# Patient Record
Sex: Male | Born: 1995 | Race: White | Hispanic: No | Marital: Single | Smoking: Current some day smoker
Health system: Southern US, Community
[De-identification: ages and names within clinical notes are randomized; demographics above are authoritative.]

## PROBLEM LIST (undated history)

## (undated) DIAGNOSIS — R569 Unspecified convulsions: Secondary | ICD-10-CM

## (undated) HISTORY — PX: HERNIA REPAIR: SHX51

---
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
## 2015-09-28 ENCOUNTER — Encounter (HOSPITAL_COMMUNITY): Payer: Self-pay | Admitting: *Deleted

## 2015-09-28 ENCOUNTER — Emergency Department (HOSPITAL_COMMUNITY): Payer: PRIVATE HEALTH INSURANCE

## 2015-09-28 ENCOUNTER — Emergency Department (HOSPITAL_COMMUNITY)
Admission: EM | Admit: 2015-09-28 | Discharge: 2015-09-28 | Disposition: A | Discharge status: Home / Self Care | Payer: PRIVATE HEALTH INSURANCE | Attending: Emergency Medicine | Admitting: Emergency Medicine

## 2015-09-28 DIAGNOSIS — R509 Fever, unspecified: Secondary | ICD-10-CM | POA: Insufficient documentation

## 2015-09-28 DIAGNOSIS — R110 Nausea: Secondary | ICD-10-CM | POA: Diagnosis not present

## 2015-09-28 DIAGNOSIS — R6889 Other general symptoms and signs: Secondary | ICD-10-CM

## 2015-09-28 DIAGNOSIS — F17210 Nicotine dependence, cigarettes, uncomplicated: Secondary | ICD-10-CM | POA: Diagnosis not present

## 2015-09-28 DIAGNOSIS — M791 Myalgia: Secondary | ICD-10-CM | POA: Diagnosis not present

## 2015-09-28 MED ORDER — BENZONATATE 100 MG PO CAPS: 100.0000 mg | ORAL_CAPSULE | Freq: Three times a day (TID) | ORAL | Status: AC

## 2015-09-28 MED ORDER — ONDANSETRON 4 MG PO TBDP: 4.0000 mg | ORAL_TABLET | Freq: Three times a day (TID) | ORAL | Status: AC | PRN

## 2015-09-28 MED ORDER — IBUPROFEN 800 MG PO TABS: 800.0000 mg | ORAL_TABLET | Freq: Three times a day (TID) | ORAL | Status: AC

## 2015-09-28 NOTE — ED Notes (Signed)
Pt back to room from xray.

## 2015-09-28 NOTE — ED Notes (Signed)
Pt here with complaint that he is "sick" for the past 3 days he has run a temp between 99 and 103.5.  Pt has has been taking tylenol and ibuprofen at home.  Pt has had URI, nausea and HA, no acute distress in triage.

## 2015-09-28 NOTE — ED Notes (Signed)
Patient transported to X-ray 

## 2015-09-28 NOTE — ED Provider Notes (Signed)
CSN: [id number]     Arrival date & time 09/28/15  2009 History  By signing my name below, I, Sean Lutz, attest that this documentation has been prepared under the direction and in the presence of Kiasha Bellin, PA-C. Electronically Signed: Doreatha Lutz, ED Scribe. 09/28/2015. 9:35 PM.    Chief Complaint  Patient presents with  . Illness   The history is provided by the patient and a relative. No language interpreter was used.   HPI Comments: Sean Lutz is a 20 y.o. male who presents to the Emergency Department complaining of moderate, intermittent fever (Tmax 103.5) onset 3 days ago with associated nausea, generalized myalgias. Pt states he has taken ibuprofen with temporary relief of fever. No known sick contacts with similar symptoms. No recent travel. Per family member, pt has h/o febrile seizures and is not medicated for this issue. Denies emesis, diarrhea, abdominal pain, dysuria, rash, swelling to hands or feet, neuro deficits, additional complaints.   History reviewed. No pertinent past medical history. Past Surgical History  Procedure Laterality Date  . Hernia repair     No family history on file. Social History  Substance Use Topics  . Smoking status: Current Some Day Smoker    Types: Cigarettes  . Smokeless tobacco: None     Comment: occasional smoker  . Alcohol Use: No    Review of Systems  Constitutional: Positive for fever.  Cardiovascular: Negative for leg swelling.  Gastrointestinal: Positive for nausea. Negative for vomiting, abdominal pain and diarrhea.  Genitourinary: Negative for dysuria.  Musculoskeletal: Positive for myalgias ( generalized ).  Skin: Negative for rash.   Allergies  Review of patient's allergies indicates no known allergies.  Home Medications   Prior to Admission medications   Medication Sig Start Date End Date Taking? Authorizing Provider  benzonatate (TESSALON) 100 MG capsule Take 1 capsule (100 mg total) by mouth every 8 (eight)  hours. 09/28/15   Demetress Tift C Halton Neas, PA-C  ibuprofen (ADVIL,MOTRIN) 800 MG tablet Take 1 tablet (800 mg total) by mouth 3 (three) times daily. 09/28/15   Vita Currin C Caren Garske, PA-C  ondansetron (ZOFRAN ODT) 4 MG disintegrating tablet Take 1 tablet (4 mg total) by mouth every 8 (eight) hours as needed for nausea or vomiting. 09/28/15   Endia Moncur C Dequon Schnebly, PA-C   BP 119/64 mmHg  Pulse 83  Temp(Src) 99.5 F (37.5 C) (Oral)  Resp 16  Ht  (1.778 m)  Wt 81.647 kg  BMI 25.83 kg/m2  SpO2 99% Physical Exam  Constitutional: He is oriented to person, place, and time. He appears well-developed and well-nourished. No distress.  HENT:  Head: Normocephalic and atraumatic.  Eyes: Conjunctivae are normal. Pupils are equal, round, and reactive to light.  Neck: Normal range of motion. Neck supple.  Cardiovascular: Normal rate, regular rhythm, normal heart sounds and intact distal pulses.  Exam reveals no gallop and no friction rub.   No murmur heard. Pulmonary/Chest: Effort normal and breath sounds normal. No respiratory distress. He has no wheezes. He has no rales.  Abdominal: Soft. He exhibits no mass. There is no tenderness. There is no rebound and no guarding.  Musculoskeletal: Normal range of motion. He exhibits no edema or tenderness.  Lymphadenopathy:    He has no cervical adenopathy.  Neurological: He is alert and oriented to person, place, and time.  Skin: Skin is warm and dry. He is not diaphoretic.  Psychiatric: He has a normal mood and affect. His behavior is normal.  Nursing note and  vitals reviewed.   ED Course  Procedures (including critical care time) DIAGNOSTIC STUDIES: Oxygen Saturation is 95% on RA, adequate by my interpretation.    COORDINATION OF CARE: 9:30 PM Discussed treatment plan with pt at bedside which includes CXR and pt agreed to plan.   Imaging Review Dg Chest 2 View  09/28/2015  CLINICAL DATA:  Productive cough with shortness of breath, congestion and fever for 3 days. EXAM: CHEST   2 VIEW COMPARISON:  12/24/2010 FINDINGS: The heart size and mediastinal contours are within normal limits. Both lungs are clear. No pleural effusion or pneumothorax. The visualized skeletal structures are unremarkable. IMPRESSION: No active cardiopulmonary disease. Electronically Signed   By: Amie Portlandavid  Ormond M.D.   On: 09/28/2015 20:57   I have personally reviewed and evaluated these images as part of my medical decision-making.   MDM   Final diagnoses:  Flu-like symptoms    Sean Lutz presents to the ED for evaluation of fever, generalized myalgias and nausea for 3 days. CXR negative for active cardiopulmonary disease. Suspect viral etiology. Conservative therapies discussed and recommended. D/t h/o febrile seizures, importance of alternating tylenol and ibuprofen q4h for control of fevers was discussed with pt and outlined in d/c paperwork. Patient advised to follow up with PCP as needed, or with worsening symptoms. Patient appears stable for discharge at this time. Return precautions discussed and outlined in discharge paperwork. Patient is agreeable to plan.    Filed Vitals:   09/28/15 2037 09/28/15 2144  BP: 128/61 119/64  Pulse: 107 83  Temp: 99.5 F (37.5 C)   Resp: 16 16     I personally performed the services described in this documentation, which was scribed in my presence. The recorded information has been reviewed and is accurate.   Anselm PancoastShawn C Fedra Lanter, PA-C 09/28/15 2229  Pricilla LovelessScott Goldston, MD 10/03/15 [id number]

## 2015-09-28 NOTE — Discharge Instructions (Signed)
You have been seen today for flulike symptoms. Your symptoms are consistent with a viral illness. Viruses do not require antibiotics. Treatment is symptomatic care. Drink plenty of fluids and get plenty of rest. You should be drinking at least a liter of water an hour to stay hydrated. Ibuprofen or Tylenol for pain or fever. Zofran for nausea. Tessalon for cough. Plain Mucinex may help relieve congestion. Warm liquids or Chloraseptic spray may help soothe the sore throat. Follow up with PCP as needed. Return to ED should symptoms worsen. Alternate ibuprofen and Tylenol every 4 hours for adequate fever control.

## 2016-08-03 ENCOUNTER — Emergency Department (HOSPITAL_COMMUNITY)
Admission: EM | Admit: 2016-08-03 | Discharge: 2016-08-03 | Disposition: A | Discharge status: Home / Self Care | Payer: PRIVATE HEALTH INSURANCE | Attending: Emergency Medicine | Admitting: Emergency Medicine

## 2016-08-03 ENCOUNTER — Encounter (HOSPITAL_COMMUNITY): Payer: Self-pay | Admitting: Emergency Medicine

## 2016-08-03 DIAGNOSIS — R197 Diarrhea, unspecified: Secondary | ICD-10-CM

## 2016-08-03 DIAGNOSIS — F17210 Nicotine dependence, cigarettes, uncomplicated: Secondary | ICD-10-CM | POA: Insufficient documentation

## 2016-08-03 DIAGNOSIS — R110 Nausea: Secondary | ICD-10-CM | POA: Insufficient documentation

## 2016-08-03 DIAGNOSIS — E860 Dehydration: Secondary | ICD-10-CM | POA: Insufficient documentation

## 2016-08-03 LAB — CBC WITH DIFFERENTIAL/PLATELET
BASOS PCT: 0 %
Basophils Absolute: 0 10*3/uL (ref 0.0–0.1)
EOS ABS: 0.1 10*3/uL (ref 0.0–0.7)
Eosinophils Relative: 1 %
HCT: 38.6 % — ABNORMAL LOW (ref 39.0–52.0)
Hemoglobin: 13.6 g/dL (ref 13.0–17.0)
Lymphocytes Relative: 12 %
Lymphs Abs: 0.8 10*3/uL (ref 0.7–4.0)
MCH: 32.2 pg (ref 26.0–34.0)
MCHC: 35.2 g/dL (ref 30.0–36.0)
MCV: 91.3 fL (ref 78.0–100.0)
MONO ABS: 0.7 10*3/uL (ref 0.1–1.0)
MONOS PCT: 11 %
Neutro Abs: 4.7 10*3/uL (ref 1.7–7.7)
Neutrophils Relative %: 76 %
Platelets: 260 10*3/uL (ref 150–400)
RBC: 4.23 MIL/uL (ref 4.22–5.81)
RDW: 12.1 % (ref 11.5–15.5)
WBC: 6.2 10*3/uL (ref 4.0–10.5)

## 2016-08-03 LAB — COMPREHENSIVE METABOLIC PANEL
ALBUMIN: 3.8 g/dL (ref 3.5–5.0)
ALT: 14 U/L — ABNORMAL LOW (ref 17–63)
ANION GAP: 8 (ref 5–15)
AST: 16 U/L (ref 15–41)
Alkaline Phosphatase: 48 U/L (ref 38–126)
BUN: 8 mg/dL (ref 6–20)
CO2: 25 mmol/L (ref 22–32)
Calcium: 8.5 mg/dL — ABNORMAL LOW (ref 8.9–10.3)
Chloride: 108 mmol/L (ref 101–111)
Creatinine, Ser: 1 mg/dL (ref 0.61–1.24)
GFR calc Af Amer: >60 mL/min (ref >60)
GFR calc non Af Amer: >60 mL/min (ref >60)
GLUCOSE: 97 mg/dL (ref 65–99)
POTASSIUM: 3.9 mmol/L (ref 3.5–5.1)
SODIUM: 141 mmol/L (ref 135–145)
Total Bilirubin: 0.2 mg/dL — ABNORMAL LOW (ref 0.3–1.2)
Total Protein: 5.8 g/dL — ABNORMAL LOW (ref 6.5–8.1)

## 2016-08-03 LAB — URINALYSIS, ROUTINE W REFLEX MICROSCOPIC
BILIRUBIN URINE: NEGATIVE
Glucose, UA: NEGATIVE mg/dL
Hgb urine dipstick: NEGATIVE
KETONES UR: NEGATIVE mg/dL
Leukocytes, UA: NEGATIVE
NITRITE: NEGATIVE
Protein, ur: NEGATIVE mg/dL
Specific Gravity, Urine: 1.013 (ref 1.005–1.030)
pH: 6 (ref 5.0–8.0)

## 2016-08-03 LAB — LIPASE, BLOOD: Lipase: 15 U/L (ref 11–51)

## 2016-08-03 MED ORDER — LOPERAMIDE HCL 2 MG PO CAPS
4.0000 mg | ORAL_CAPSULE | Freq: Once | ORAL | Status: AC
  Administered 2016-08-03: 4 mg via ORAL
  Filled 2016-08-03: qty 2

## 2016-08-03 MED ORDER — ONDANSETRON HCL 4 MG/2ML IJ SOLN
4.0000 mg | Freq: Once | INTRAMUSCULAR | Status: AC
  Administered 2016-08-03: 4 mg via INTRAVENOUS
  Filled 2016-08-03: qty 2

## 2016-08-03 MED ORDER — PROMETHAZINE HCL 25 MG PO TABS: 25.0000 mg | ORAL_TABLET | Freq: Four times a day (QID) | ORAL | 0 refills | Status: AC | PRN

## 2016-08-03 MED ORDER — SODIUM CHLORIDE 0.9 % IV BOLUS (SEPSIS)
1000.0000 mL | Freq: Once | INTRAVENOUS | Status: AC
  Administered 2016-08-03: 1000 mL via INTRAVENOUS

## 2016-08-03 NOTE — ED Provider Notes (Signed)
[facility]-EMERGENCY DEPT Provider Note   CSN: [id number] Arrival date & time: 08/03/16  0701     History   Chief Complaint Chief Complaint  Patient presents with  . Nausea  . Diarrhea    HPI Sean Lutz is a 21 y.o. male.  Pt has had nausea since yesterday at 1500.  Diarrhea woke him out of bed at 0300 and has continued.  Pt denies any fevers or chills.  The pt has not had a cough.  He was worried he had the flu.  The pt did eat some bologna and a birthday cake that might have been old.      History reviewed. No pertinent past medical history.  Patient Active Problem List   Diagnosis Date Noted  . SYNCOPE 07/19/2010  . 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 GENERALIZED PAIN 10/03/2009  . ACROMIOCLAVICULAR SPRAIN AND STRAIN 04/06/2009    Past Surgical History:  Procedure Laterality Date  . HERNIA REPAIR         Home Medications    Prior to Admission medications   Medication Sig Start Date End Date Taking? Authorizing Provider  benzonatate (TESSALON) 100 MG capsule Take 1 capsule (100 mg total) by mouth every 8 (eight) hours. 09/28/15   Shawn C Joy, PA-C  ibuprofen (ADVIL,MOTRIN) 800 MG tablet Take 1 tablet (800 mg total) by mouth 3 (three) times daily. 09/28/15   Shawn C Joy, PA-C  ondansetron (ZOFRAN ODT) 4 MG disintegrating tablet Take 1 tablet (4 mg total) by mouth every 8 (eight) hours as needed for nausea or vomiting. 09/28/15   Shawn C Joy, PA-C  promethazine (PHENERGAN) 25 MG tablet Take 1 tablet (25 mg total) by mouth every 6 (six) hours as needed for nausea or vomiting. 08/03/16   Jacalyn LefevreJulie Chianti Goh, MD    Family History No family history on file.  Social History Social History  Substance Use Topics  . Smoking status: Current Some Day Smoker    Types: Cigarettes  . Smokeless tobacco: Not on file     Comment: occasional smoker  . Alcohol use No     Allergies   Patient has no known allergies.   Review of Systems Review of Systems  Gastrointestinal: Positive  for diarrhea and nausea.  All other systems reviewed and are negative.    Physical Exam Updated Vital Signs BP 111/61   Pulse 76   Temp 97.9 F (36.6 C) (Oral)   Resp 18   SpO2 99%   Physical Exam  Constitutional: He is oriented to person, place, and time. He appears well-developed and well-nourished.  HENT:  Head: Normocephalic and atraumatic.  Right Ear: External ear normal.  Left Ear: External ear normal.  Nose: Nose normal.  Mouth/Throat: Mucous membranes are dry.  Eyes: Conjunctivae and EOM are normal. Pupils are equal, round, and reactive to light.  Neck: Normal range of motion. Neck supple.  Cardiovascular: Normal rate, regular rhythm, normal heart sounds and intact distal pulses.   Pulmonary/Chest: Effort normal and breath sounds normal.  Abdominal: Soft. Bowel sounds are normal.  Musculoskeletal: Normal range of motion.  Neurological: He is alert and oriented to person, place, and time.  Skin: Skin is warm.  Psychiatric: He has a normal mood and affect. His behavior is normal. Judgment and thought content normal.  Nursing note and vitals reviewed.    ED Treatments / Results  Labs (all labs ordered are listed, but only abnormal results are displayed) Labs Reviewed  CBC WITH DIFFERENTIAL/PLATELET - Abnormal; Notable for  the following:       Result Value   HCT 38.6 (*)    All other components within normal limits  URINALYSIS, ROUTINE W REFLEX MICROSCOPIC - Abnormal; Notable for the following:    Color, Urine STRAW (*)    All other components within normal limits  COMPREHENSIVE METABOLIC PANEL - Abnormal; Notable for the following:    Calcium 8.5 (*)    Total Protein 5.8 (*)    ALT 14 (*)    Total Bilirubin 0.2 (*)    All other components within normal limits  GASTROINTESTINAL PANEL BY PCR, STOOL (REPLACES STOOL CULTURE)  LIPASE, BLOOD    EKG  EKG Interpretation None       Radiology No results found.  Procedures Procedures (including critical  care time)  Medications Ordered in ED Medications  sodium chloride 0.9 % bolus 1,000 mL (1,000 mLs Intravenous New Bag/Given 08/03/16 0747)  ondansetron (ZOFRAN) injection 4 mg (4 mg Intravenous Given 08/03/16 0747)  loperamide (IMODIUM) capsule 4 mg (4 mg Oral Given 08/03/16 0747)     Initial Impression / Assessment and Plan / ED Course  I have reviewed the triage vital signs and the nursing notes.  Pertinent labs & imaging results that were available during my care of the patient were reviewed by me and considered in my medical decision making (see chart for details).     Pt is feeling much better.  He has not had any diarrhea while here.  Pt is stable for d/c.  He knows to return if worse.  Final Clinical Impressions(s) / ED Diagnoses   Final diagnoses:  Nausea  Diarrhea, unspecified type  Dehydration    New Prescriptions New Prescriptions   PROMETHAZINE (PHENERGAN) 25 MG TABLET    Take 1 tablet (25 mg total) by mouth every 6 (six) hours as needed for nausea or vomiting.     Jacalyn Lefevre, MD 08/03/16 [phone]

## 2016-08-03 NOTE — ED Triage Notes (Addendum)
Patient presents today with complaints of Nausea and Diarrhea. Patient states Nausea started yesterday at 3pm. Patient states he had of diarrhea at 0300 then went back to bed then had an additional  at 0600 and has been going "every five minutes".  Patient states she has not been in the last 30 minutes, Patient states everyone at work is sick with flu and wanted to make sure it was not flu. Patient denies any Cough. Patient states he does have body achs. Denies any fever.

## 2016-08-03 NOTE — ED Notes (Signed)
Nurse drawing labs. 

## 2017-06-12 ENCOUNTER — Emergency Department: Payer: Self-pay

## 2017-06-12 ENCOUNTER — Emergency Department
Admission: EM | Admit: 2017-06-12 | Discharge: 2017-06-12 | Disposition: A | Discharge status: Home / Self Care | Payer: Self-pay | Attending: Emergency Medicine | Admitting: Emergency Medicine

## 2017-06-12 DIAGNOSIS — R509 Fever, unspecified: Secondary | ICD-10-CM | POA: Insufficient documentation

## 2017-06-12 DIAGNOSIS — J069 Acute upper respiratory infection, unspecified: Secondary | ICD-10-CM | POA: Insufficient documentation

## 2017-06-12 DIAGNOSIS — Z791 Long term (current) use of non-steroidal anti-inflammatories (NSAID): Secondary | ICD-10-CM | POA: Insufficient documentation

## 2017-06-12 DIAGNOSIS — F17210 Nicotine dependence, cigarettes, uncomplicated: Secondary | ICD-10-CM | POA: Insufficient documentation

## 2017-06-12 HISTORY — DX: Unspecified convulsions: R56.9

## 2017-06-12 LAB — BASIC METABOLIC PANEL
ANION GAP: 8 (ref 5–15)
BUN: 14 mg/dL (ref 6–20)
CO2: 25 mmol/L (ref 22–32)
Calcium: 9 mg/dL (ref 8.9–10.3)
Chloride: 104 mmol/L (ref 101–111)
Creatinine, Ser: 1.08 mg/dL (ref 0.61–1.24)
GLUCOSE: 115 mg/dL — AB (ref 65–99)
POTASSIUM: 3.7 mmol/L (ref 3.5–5.1)
Sodium: 137 mmol/L (ref 135–145)

## 2017-06-12 LAB — URINALYSIS, COMPLETE (UACMP) WITH MICROSCOPIC
Bacteria, UA: NONE SEEN
Bilirubin Urine: NEGATIVE
GLUCOSE, UA: NEGATIVE mg/dL
HGB URINE DIPSTICK: NEGATIVE
Ketones, ur: NEGATIVE mg/dL
Leukocytes, UA: NEGATIVE
Nitrite: NEGATIVE
PH: 5 (ref 5.0–8.0)
Protein, ur: NEGATIVE mg/dL
RBC / HPF: NONE SEEN RBC/hpf (ref 0–5)
SPECIFIC GRAVITY, URINE: 1.018 (ref 1.005–1.030)
SQUAMOUS EPITHELIAL / LPF: NONE SEEN

## 2017-06-12 LAB — CBC
HEMATOCRIT: 37.1 % — AB (ref 40.0–52.0)
HEMOGLOBIN: 12.9 g/dL — AB (ref 13.0–18.0)
MCH: 32 pg (ref 26.0–34.0)
MCHC: 34.6 g/dL (ref 32.0–36.0)
MCV: 92.4 fL (ref 80.0–100.0)
Platelets: 170 10*3/uL (ref 150–440)
RBC: 4.02 MIL/uL — AB (ref 4.40–5.90)
RDW: 12.5 % (ref 11.5–14.5)
WBC: 6.4 10*3/uL (ref 3.8–10.6)

## 2017-06-12 LAB — TROPONIN I

## 2017-06-12 LAB — LACTIC ACID, PLASMA: LACTIC ACID, VENOUS: 1.1 mmol/L (ref 0.5–1.9)

## 2017-06-12 MED ORDER — KETOROLAC TROMETHAMINE 60 MG/2ML IM SOLN
60.0000 mg | Freq: Once | INTRAMUSCULAR | Status: AC
  Administered 2017-06-12: 60 mg via INTRAMUSCULAR
  Filled 2017-06-12: qty 2

## 2017-06-12 NOTE — ED Provider Notes (Signed)
[facility] Emergency Department Provider Note  Time seen: 10:02 PM  I have reviewed the triage vital signs and the nursing notes.   HISTORY  Chief Complaint Chest Pain    HPI Sean Lutz is a 21 y.o. male with a past medical history of febrile seizures, presents to the emergency department for fever.  According to the patient for the past few hours he has been feeling chills had a 102 fever at home.  States he has had some mild nasal congestion but denies any cough, states a mild headache currently.  States he was having some tightness sensation in his chest, denies any abdominal pain states nausea, but denies any vomiting or diarrhea.  Denies dysuria.  Denies any known sick exposures.   Past Medical History:  Diagnosis Date  . Seizures (HCC)    febrile    Patient Active Problem List   Diagnosis Date Noted  . SYNCOPE 07/19/2010  . 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 GENERALIZED PAIN 10/03/2009  . ACROMIOCLAVICULAR SPRAIN AND STRAIN 04/06/2009    Past Surgical History:  Procedure Laterality Date  . HERNIA REPAIR      Prior to Admission medications   Medication Sig Start Date End Date Taking? Authorizing Provider  benzonatate (TESSALON) 100 MG capsule Take 1 capsule (100 mg total) by mouth every 8 (eight) hours. 09/28/15   Joy, Shawn C, PA-C  ibuprofen (ADVIL,MOTRIN) 800 MG tablet Take 1 tablet (800 mg total) by mouth 3 (three) times daily. 09/28/15   Joy, Shawn C, PA-C  ondansetron (ZOFRAN ODT) 4 MG disintegrating tablet Take 1 tablet (4 mg total) by mouth every 8 (eight) hours as needed for nausea or vomiting. 09/28/15   Joy, Shawn C, PA-C  promethazine (PHENERGAN) 25 MG tablet Take 1 tablet (25 mg total) by mouth every 6 (six) hours as needed for nausea or vomiting. 08/03/16   Jacalyn LefevreHaviland, Julie, MD    No Known Allergies  No family history on file.  Social History Social History   Tobacco Use  . Smoking status: Current Some Day Smoker    Types:  Cigarettes  . Smokeless tobacco: Former NeurosurgeonUser  . Tobacco comment: occasional smoker  Substance Use Topics  . Alcohol use: No  . Drug use: Not on file    Review of Systems Constitutional: Positive for fever at home.  Positive for generalized fatigue. ENT: Positive for nasal congestion Cardiovascular: Positive for chest tightness earlier, now resolved Respiratory: Negative for shortness of breath.  Negative for cough Gastrointestinal: Negative for abdominal pain.  Positive for nausea.  Negative for vomiting or diarrhea Genitourinary: Negative for dysuria Neurological: Mild headache All other ROS negative  ____________________________________________   PHYSICAL EXAM:  VITAL SIGNS: ED Triage Vitals [06/12/17 1937]  Enc Vitals Group     BP 119/69     Pulse Rate (!) 120     Resp (!) 21     Temp 99.8 F (37.7 C)     Temp Source Oral     SpO2 99 %     Weight 170 lb (77.1 kg)     Height      Head Circumference      Peak Flow      Pain Score 6     Pain Loc      Pain Edu?      Excl. in GC?    Constitutional: Alert and oriented. Well appearing and in no distress. Eyes: Normal exam ENT   Head: Normocephalic and atraumatic.  Normal  tympanic membranes.   Nose: Mild rhinorrhea   Mouth/Throat: Mucous membranes are moist.  Normal-appearing oropharynx Cardiovascular: Normal rate, regular rhythm. No murmur Respiratory: Normal respiratory effort without tachypnea nor retractions. Breath sounds are clear Gastrointestinal: Soft and nontender. No distention.   Musculoskeletal: Nontender with normal range of motion in all extremities.  Neurologic:  Normal speech and language. No gross focal neurologic deficits  Skin:  Skin is warm, dry and intact.  Psychiatric: Mood and affect are normal.   ____________________________________________    EKG  EKG reviewed and interpreted by myself shows sinus tachycardia at 105 bpm with a narrow QRS, normal axis, normal intervals with no  concerning ST changes.  ____________________________________________    RADIOLOGY  Chest x-ray normal  ____________________________________________   INITIAL IMPRESSION / ASSESSMENT AND PLAN / ED COURSE  Pertinent labs & imaging results that were available during my care of the patient were reviewed by me and considered in my medical decision making (see chart for details).  Patient presents to the emergency department for fever, nausea, generalized fatigue and chest tightness as well as headache.  Differential is quite broad but would include viral infection, bacterial infection, URI, UTI, intra-abdominal pathology.  Overall the patient appears extremely well, completely nontender abdominal exam.  Clear lung sounds on exam with a normal chest x-ray.  Labs are largely within normal limits including a normal white blood cell count and lactic acid.  Patient is stating a mild to moderate headache also mild to moderate rhinorrhea.  I suspect likely viral illness.  We will dose Toradol continue to closely monitor in the emergency department.  Urinalysis pending.  Overall the patient appears very well, no distress is agreeable to this plan of care.  Much better after the Toradol.  We will discharge with supportive care I discussed alternating Tylenol and ibuprofen if needed for fever or discomfort.  Highly suspect viral illness.  Discussed return precautions.  Patient agreeable to plan of care.  ____________________________________________   FINAL CLINICAL IMPRESSION(S) / ED DIAGNOSES  Fever URI    Minna AntisPaduchowski, Andre Swander, MD 06/12/17 2233

## 2017-06-12 NOTE — ED Triage Notes (Signed)
Patient c/o chest pain, SOB, fever, and nausea. Patient reports hx of seizures with fevers, last seizure approx 3 years ago. Patient took 3 regular strength tylenol approx 35 minutes ago. Patient's temperature was 101.5 at home.

## 2017-06-15 ENCOUNTER — Encounter (HOSPITAL_COMMUNITY): Payer: Self-pay | Admitting: *Deleted

## 2017-06-15 ENCOUNTER — Emergency Department (HOSPITAL_COMMUNITY)
Admission: EM | Admit: 2017-06-15 | Discharge: 2017-06-16 | Disposition: A | Discharge status: Home / Self Care | Payer: Self-pay | Attending: Emergency Medicine | Admitting: Emergency Medicine

## 2017-06-15 ENCOUNTER — Other Ambulatory Visit: Payer: Self-pay

## 2017-06-15 DIAGNOSIS — F17210 Nicotine dependence, cigarettes, uncomplicated: Secondary | ICD-10-CM | POA: Insufficient documentation

## 2017-06-15 DIAGNOSIS — R51 Headache: Secondary | ICD-10-CM | POA: Insufficient documentation

## 2017-06-15 DIAGNOSIS — Z79899 Other long term (current) drug therapy: Secondary | ICD-10-CM | POA: Insufficient documentation

## 2017-06-15 DIAGNOSIS — R519 Headache, unspecified: Secondary | ICD-10-CM

## 2017-06-15 NOTE — ED Triage Notes (Signed)
The pt is c/o a headache for 3-4 days  He was seen at [facility] for the same and he was given a toradol shjot that stopped his headache with naysea

## 2017-06-16 MED ORDER — PROCHLORPERAZINE EDISYLATE 5 MG/ML IJ SOLN
10.0000 mg | Freq: Once | INTRAMUSCULAR | Status: AC
  Administered 2017-06-16: 10 mg via INTRAVENOUS
  Filled 2017-06-16: qty 2

## 2017-06-16 MED ORDER — KETOROLAC TROMETHAMINE 30 MG/ML IJ SOLN
30.0000 mg | Freq: Once | INTRAMUSCULAR | Status: AC
  Administered 2017-06-16: 30 mg via INTRAVENOUS
  Filled 2017-06-16: qty 1

## 2017-06-16 MED ORDER — SODIUM CHLORIDE 0.9 % IV BOLUS (SEPSIS)
1000.0000 mL | Freq: Once | INTRAVENOUS | Status: AC
  Administered 2017-06-16: 1000 mL via INTRAVENOUS

## 2017-06-16 NOTE — Discharge Instructions (Signed)
We are glad you are feeling better. You can return here for any new/worsening symptoms.

## 2017-06-16 NOTE — ED Provider Notes (Signed)
Sean Lutz [facility] Provider Note   CSN: [id number] Arrival date & time: 06/15/17  2030     History   Chief Complaint Chief Complaint  Patient presents with  . Headache    HPI Sean Lutz is a 21 y.o. male.  The history is provided by the patient and medical records.     21 year old male with history of febrile seizures in childhood, presenting to the ED with headache.  Reports he was seen a few days ago at [facility] and was diagnosed with a 24-hour bug.  States he was feeling better, but headache returned over the past 2 days or so.  States initially pain was on the right side of his head, now is generalized.  Headache feels pulsatile in nature.  He has associated photophobia, phonophobia, nausea, and lightheadedness with standing.  They did not really felt like eating and drinking but has not had any vomiting.  He denies any focal numbness or weakness of his arms or legs.  No difficulty walking.  No changes in speech.  No confusion.  No fever, chills in the past 24 hours.  Not currently on anti-coagulation.  No hx of migraines.  Past Medical History:  Diagnosis Date  . Seizures (HCC)    febrile    Patient Active Problem List   Diagnosis Date Noted  . SYNCOPE 07/19/2010  . 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 GENERALIZED PAIN 10/03/2009  . ACROMIOCLAVICULAR SPRAIN AND STRAIN 04/06/2009    Past Surgical History:  Procedure Laterality Date  . HERNIA REPAIR         Home Medications    Prior to Admission medications   Medication Sig Start Date End Date Taking? Authorizing Provider  benzonatate (TESSALON) 100 MG capsule Take 1 capsule (100 mg total) by mouth every 8 (eight) hours. 09/28/15   Joy, Shawn C, PA-C  ibuprofen (ADVIL,MOTRIN) 800 MG tablet Take 1 tablet (800 mg total) by mouth 3 (three) times daily. 09/28/15   Joy, Shawn C, PA-C  ondansetron (ZOFRAN ODT) 4 MG disintegrating tablet Take 1 tablet (4 mg total) by mouth every 8  (eight) hours as needed for nausea or vomiting. 09/28/15   Joy, Shawn C, PA-C  promethazine (PHENERGAN) 25 MG tablet Take 1 tablet (25 mg total) by mouth every 6 (six) hours as needed for nausea or vomiting. 08/03/16   Jacalyn LefevreHaviland, Julie, MD    Family History No family history on file.  Social History Social History   Tobacco Use  . Smoking status: Current Some Day Smoker    Types: Cigarettes  . Smokeless tobacco: Former NeurosurgeonUser  . Tobacco comment: occasional smoker  Substance Use Topics  . Alcohol use: No  . Drug use: Not on file     Allergies   Patient has no known allergies.   Review of Systems Review of Systems  Neurological: Positive for headaches.  All other systems reviewed and are negative.    Physical Exam Updated Vital Signs BP 123/79   Pulse 63   Temp 99.1 F (37.3 C) (Oral)   Resp 16   Ht 5\' 9"  (1.753 m)   Wt 77.1 kg (170 lb)   SpO2 97%   BMI 25.10 kg/m   Physical Exam  Constitutional: He is oriented to person, place, and time. He appears well-developed and well-nourished. No distress.  HENT:  Head: Normocephalic and atraumatic.  Right Ear: External ear normal.  Left Ear: External ear normal.  Eyes: Conjunctivae and EOM are normal. Pupils  are equal, round, and reactive to light.  Neck: Normal range of motion and full passive range of motion without pain. Neck supple. No neck rigidity.  No rigidity, no meningismus  Cardiovascular: Normal rate, regular rhythm and normal heart sounds.  No murmur heard. Pulmonary/Chest: Effort normal and breath sounds normal. No respiratory distress. He has no wheezes. He has no rhonchi.  Abdominal: Soft. Bowel sounds are normal. There is no tenderness. There is no guarding.  Musculoskeletal: Normal range of motion. He exhibits no edema.  Neurological: He is alert and oriented to person, place, and time. He has normal strength. He displays no tremor. No cranial nerve deficit or sensory deficit. He displays no seizure  activity.  AAOx3, answering questions and following commands appropriately; equal strength UE and LE bilaterally; CN grossly intact; moves all extremities appropriately without ataxia; no focal neuro deficits or facial asymmetry appreciated  Skin: Skin is warm and dry. No rash noted. He is not diaphoretic.  Psychiatric: He has a normal mood and affect. His behavior is normal. Thought content normal.  Nursing note and vitals reviewed.    ED Treatments / Results  Labs (all labs ordered are listed, but only abnormal results are displayed) Labs Reviewed - No data to display  EKG  EKG Interpretation None       Radiology No results found.  Procedures Procedures (including critical care time)  Medications Ordered in ED Medications - No data to display   Initial Impression / Assessment and Plan / ED Course  I have reviewed the triage vital signs and the nursing notes.  Pertinent labs & imaging results that were available during my care of the patient were reviewed by me and considered in my medical decision making (see chart for details).  21 y.o. M here with headache.  Ongoing for a few days now.  Left sided initially, now diffuse.  Pain throbbing in nature associated with nausea, photophobia, and phonophobia.  AAOx3 here.  No focal neurologic deficits.  No signs/symptoms concerning for meningitis.  Symptoms and presentation most consistent with migraine type headache.  Will treat with migraine cocktail.  Do not feel he needs emergent head CT at this time.  Patient in agreement with plan.  2:57 AM Headache resolved here after migraine cocktail.  Patient's vitals remained stable.  Neurologically intact.  Appear stable for discharge home.  He understands to return here for any new/worsening symptoms.  Final Clinical Impressions(s) / ED Diagnoses   Final diagnoses:  Bad headache    ED Discharge Orders    None       Garlon HatchetSanders, Zoeann Mol M, PA-C 06/16/17 0306    Azalia Bilisampos, Kevin,  MD 06/16/17 [phone]

## 2017-06-16 NOTE — ED Notes (Signed)
Pt reports having a headache and what he was told as the "24" hour bug. Pt was seen at [facility] for same and told to take ibuprofen and tylenol at home, all of which he states is not working.

## 2018-05-15 ENCOUNTER — Ambulatory Visit (INDEPENDENT_AMBULATORY_CARE_PROVIDER_SITE_OTHER): Payer: Self-pay

## 2018-05-15 ENCOUNTER — Other Ambulatory Visit (INDEPENDENT_AMBULATORY_CARE_PROVIDER_SITE_OTHER): Payer: Self-pay

## 2018-05-15 ENCOUNTER — Ambulatory Visit (INDEPENDENT_AMBULATORY_CARE_PROVIDER_SITE_OTHER): Payer: BLUE CROSS/BLUE SHIELD | Admitting: Orthopaedic Surgery

## 2018-05-15 DIAGNOSIS — M79641 Pain in right hand: Secondary | ICD-10-CM

## 2018-05-15 NOTE — Progress Notes (Signed)
Office Visit Note   Patient: Sean Lutz           Date of Birth: 08-11-1995           MRN: [id number] Visit Date: 05/15/2018              Requested by: No referring provider defined for this encounter. PCP: Patient, No Pcp Per   Assessment & Plan: Visit Diagnoses:  1. Pain in right hand     Plan: Impression is right fifth metacarpal base fracture.  There is some slight comminution to the fracture but overall I think the alignment is acceptable and should heal fine.  Patient understands that he may develop posttraumatic arthritis from this.  We will place him in a removable ulnar gutter splint to wear at all times except when bathing.  Work note was provided today.  Follow-up in 3 weeks with three-view x-rays of the right hand.  Anticipate initiating therapy at that time.  Follow-Up Instructions: Return in about 3 weeks (around 06/05/2018).   Orders:  Orders Placed This Encounter  Procedures  . XR Hand Complete Right   No orders of the defined types were placed in this encounter.     Procedures: No procedures performed   Clinical Data: No additional findings.   Subjective: Chief Complaint  Patient presents with  . Right Hand - Pain    Sean Lutz is 22 year old healthy right-hand-dominant gentleman who punched a wall 5 to 6 days ago who comes in today for evaluation of this injury.  He originally presented to the [facility] ER and was told to follow-up with an orthopedic surgeon.  He comes in today with the main complaint of pain and swelling.  The pain is overall better at rest and worse with movement and use of the hand.  He denies any numbness and tingling.  He is taking over-the-counter medicines.   Review of Systems  Constitutional: Negative.   All other systems reviewed and are negative.    Objective: Vital Signs: There were no vitals taken for this visit.  Physical Exam  Constitutional: He is oriented to person, place, and time. He appears well-developed and  well-nourished.  HENT:  Head: Normocephalic and atraumatic.  Eyes: Pupils are equal, round, and reactive to light.  Neck: Neck supple.  Pulmonary/Chest: Effort normal.  Abdominal: Soft.  Musculoskeletal: Normal range of motion.  Neurological: He is alert and oriented to person, place, and time.  Skin: Skin is warm.  Psychiatric: He has a normal mood and affect. His behavior is normal. Judgment and thought content normal.  Nursing note and vitals reviewed.   Ortho Exam Right hand exam shows no neurovascular compromise.  No rotational deformities of the fifth ray.  He is limited range of motion secondary to pain and guarding.  He does have swelling of the right hand. Specialty Comments:  No specialty comments available.  Imaging: Xr Hand Complete Right  Result Date: 05/15/2018 Mildly displaced and comminuted base of fifth metacarpal fracture.  No evidence of CMC dislocation.    PMFS History: Patient Active Problem List   Diagnosis Date Noted  . SYNCOPE 07/19/2010  . 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 GENERALIZED PAIN 10/03/2009  . ACROMIOCLAVICULAR SPRAIN AND STRAIN 04/06/2009   Past Medical History:  Diagnosis Date  . Seizures (HCC)    febrile    No family history on file.  Past Surgical History:  Procedure Laterality Date  . HERNIA REPAIR     Social History   Occupational  History  . Not on file  Tobacco Use  . Smoking status: Current Some Day Smoker    Types: Cigarettes  . Smokeless tobacco: Former NeurosurgeonUser  . Tobacco comment: occasional smoker  Substance and Sexual Activity  . Alcohol use: No  . Drug use: Not on file  . Sexual activity: Not on file

## 2018-06-26 ENCOUNTER — Ambulatory Visit (INDEPENDENT_AMBULATORY_CARE_PROVIDER_SITE_OTHER): Payer: BLUE CROSS/BLUE SHIELD | Admitting: Orthopaedic Surgery

## 2018-07-02 ENCOUNTER — Encounter (INDEPENDENT_AMBULATORY_CARE_PROVIDER_SITE_OTHER): Payer: Self-pay | Admitting: Physician Assistant

## 2018-07-02 ENCOUNTER — Ambulatory Visit (INDEPENDENT_AMBULATORY_CARE_PROVIDER_SITE_OTHER): Payer: Self-pay

## 2018-07-02 ENCOUNTER — Ambulatory Visit (INDEPENDENT_AMBULATORY_CARE_PROVIDER_SITE_OTHER): Payer: BLUE CROSS/BLUE SHIELD | Admitting: Physician Assistant

## 2018-07-02 DIAGNOSIS — S62346D Nondisplaced fracture of base of fifth metacarpal bone, right hand, subsequent encounter for fracture with routine healing: Secondary | ICD-10-CM

## 2018-07-02 DIAGNOSIS — M79641 Pain in right hand: Secondary | ICD-10-CM | POA: Insufficient documentation

## 2018-07-02 NOTE — Progress Notes (Signed)
   Office Visit Note   Patient: Sean Lutz           Date of Birth: [birth year]           MRN: [id number] Visit Date: 07/02/2018              Requested by: No referring provider defined for this encounter. PCP: Patient, No Pcp Per   Assessment & Plan: Visit Diagnoses:  1. Closed nondisplaced fracture of base of fifth metacarpal bone of right hand with routine healing, subsequent encounter     Plan: Impression is approximately 7 weeks status post right fifth metacarpal base fracture.  This appears to be healing fine.  The patient can start to wean out of his ulnar gutter splint.  I will start him in hand therapy.  A prescription was provided to the patient today for this.  He will continue current work restrictions for another 3 weeks.  Follow-up with [place] in 3 weeks time for repeat evaluation and 3 view x-rays of the right hand.  Follow-Up Instructions: Return in about 3 weeks (around 07/23/2018).   Orders:  Orders Placed This Encounter  Procedures  . XR Hand Complete Right   No orders of the defined types were placed in this encounter.     Procedures: No procedures performed   Clinical Data: No additional findings.   Subjective: Chief Complaint  Patient presents with  . Right Hand - Pain    HPI patient is a pleasant 23 year old who comes in today for follow-up of his right hand.  He is approximately 7 weeks status post right fifth metacarpal base fracture.  He has been compliant wearing an ulnar gutter splint.  He still has some pain when trying to lift anything.  Otherwise doing well.  Review of Systems as detailed in HPI.  All others reviewed and are negative   Objective: Vital Signs: There were no vitals taken for this visit.  Physical Exam well-developed well-nourished gentleman in no acute distress.  Alert and oriented x3.  Ortho Exam examination of his right hand reveals mild swelling.  Mild tenderness over the fracture site.  He can make a full composite  fist.  No overlap of the fourth and fifth fingers.  He is neurovascularly intact distally.  Specialty Comments:  No specialty comments available.  Imaging: Xr Hand Complete Right  Result Date: 07/02/2018 X-rays demonstrate stable alignment of the fracture with evidence of callus formation    PMFS History: Patient Active Problem List   Diagnosis Date Noted  . Pain in right hand 07/02/2018  . SYNCOPE 07/19/2010  . 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 GENERALIZED PAIN 10/03/2009  . ACROMIOCLAVICULAR SPRAIN AND STRAIN 04/06/2009   Past Medical History:  Diagnosis Date  . Seizures (HCC)    febrile    History reviewed. No pertinent family history.  Past Surgical History:  Procedure Laterality Date  . HERNIA REPAIR     Social History   Occupational History  . Not on file  Tobacco Use  . Smoking status: Current Some Day Smoker    Types: Cigarettes  . Smokeless tobacco: Former Neurosurgeon  . Tobacco comment: occasional smoker  Substance and Sexual Activity  . Alcohol use: No  . Drug use: Not on file  . Sexual activity: Not on file

## 2018-07-16 ENCOUNTER — Telehealth (INDEPENDENT_AMBULATORY_CARE_PROVIDER_SITE_OTHER): Payer: Self-pay | Admitting: Orthopaedic Surgery

## 2018-07-16 NOTE — Telephone Encounter (Signed)
Ok fine 

## 2018-07-16 NOTE — Telephone Encounter (Signed)
See message.

## 2018-07-16 NOTE — Telephone Encounter (Signed)
Patient wants to know is it neccessary to go to therapy? He states he feel ok. Nothing hurting and wants to go back to work.  Please call patient [phone]

## 2018-07-17 NOTE — Telephone Encounter (Signed)
Called patient no answer. LMOM to return call to advise on message below.  

## 2018-07-17 NOTE — Telephone Encounter (Signed)
I called number again they state we have the wrong number

## 2018-07-22 ENCOUNTER — Ambulatory Visit (INDEPENDENT_AMBULATORY_CARE_PROVIDER_SITE_OTHER): Payer: Self-pay

## 2018-07-22 ENCOUNTER — Ambulatory Visit (INDEPENDENT_AMBULATORY_CARE_PROVIDER_SITE_OTHER): Payer: BLUE CROSS/BLUE SHIELD | Admitting: Orthopaedic Surgery

## 2018-07-22 DIAGNOSIS — M79641 Pain in right hand: Secondary | ICD-10-CM

## 2018-07-22 DIAGNOSIS — S62346D Nondisplaced fracture of base of fifth metacarpal bone, right hand, subsequent encounter for fracture with routine healing: Secondary | ICD-10-CM | POA: Diagnosis not present

## 2018-07-22 NOTE — Progress Notes (Signed)
   Post-Op Visit Note   Patient: Sean Lutz           Date of Birth: 1996/05/07           MRN: [id number] Visit Date: 07/22/2018 PCP: Patient, No Pcp Per   Assessment & Plan:  Chief Complaint:  Chief Complaint  Patient presents with  . Right Hand - Follow-up   Visit Diagnoses:  1. Closed nondisplaced fracture of base of fifth metacarpal bone of right hand with routine healing, subsequent encounter   2. Pain in right hand     Plan: Sharia Reeve comes in today for follow-up of his right fifth metacarpal fracture.  He is 10 weeks from the injury.  He reports no pain and is doing quite well.  He has full hand function.  His exam is unremarkable and back to normal.  His x-rays demonstrate healing fracture.  At this point he is released to full activity.  Released to work without restrictions.  Follow-up as needed.  Follow-Up Instructions: Return if symptoms worsen or fail to improve.   Orders:  Orders Placed This Encounter  Procedures  . XR Hand Complete Right   No orders of the defined types were placed in this encounter.   Imaging: Xr Hand Complete Right  Result Date: 07/22/2018 Healed fifth metacarpal fracture.   PMFS History: Patient Active Problem List   Diagnosis Date Noted  . Pain in right hand 07/02/2018  . SYNCOPE 07/19/2010  . 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 GENERALIZED PAIN 10/03/2009  . ACROMIOCLAVICULAR SPRAIN AND STRAIN 04/06/2009   Past Medical History:  Diagnosis Date  . Seizures (HCC)    febrile    No family history on file.  Past Surgical History:  Procedure Laterality Date  . HERNIA REPAIR     Social History   Occupational History  . Not on file  Tobacco Use  . Smoking status: Current Some Day Smoker    Types: Cigarettes  . Smokeless tobacco: Former Neurosurgeon  . Tobacco comment: occasional smoker  Substance and Sexual Activity  . Alcohol use: No  . Drug use: Not on file  . Sexual activity: Not on file

## 2019-04-16 IMAGING — CR DG CHEST 2V
2 series · 2 of 2 positions shown · non-contrast
Comparison: 09/28/2015, 02/23/2011, 04/01/2009.

CLINICAL DATA: Acute onset of left lower chest pain, shortness of
breath, fever and nausea that began this morning.

EXAM:
CHEST  2 VIEW

[chest pa]
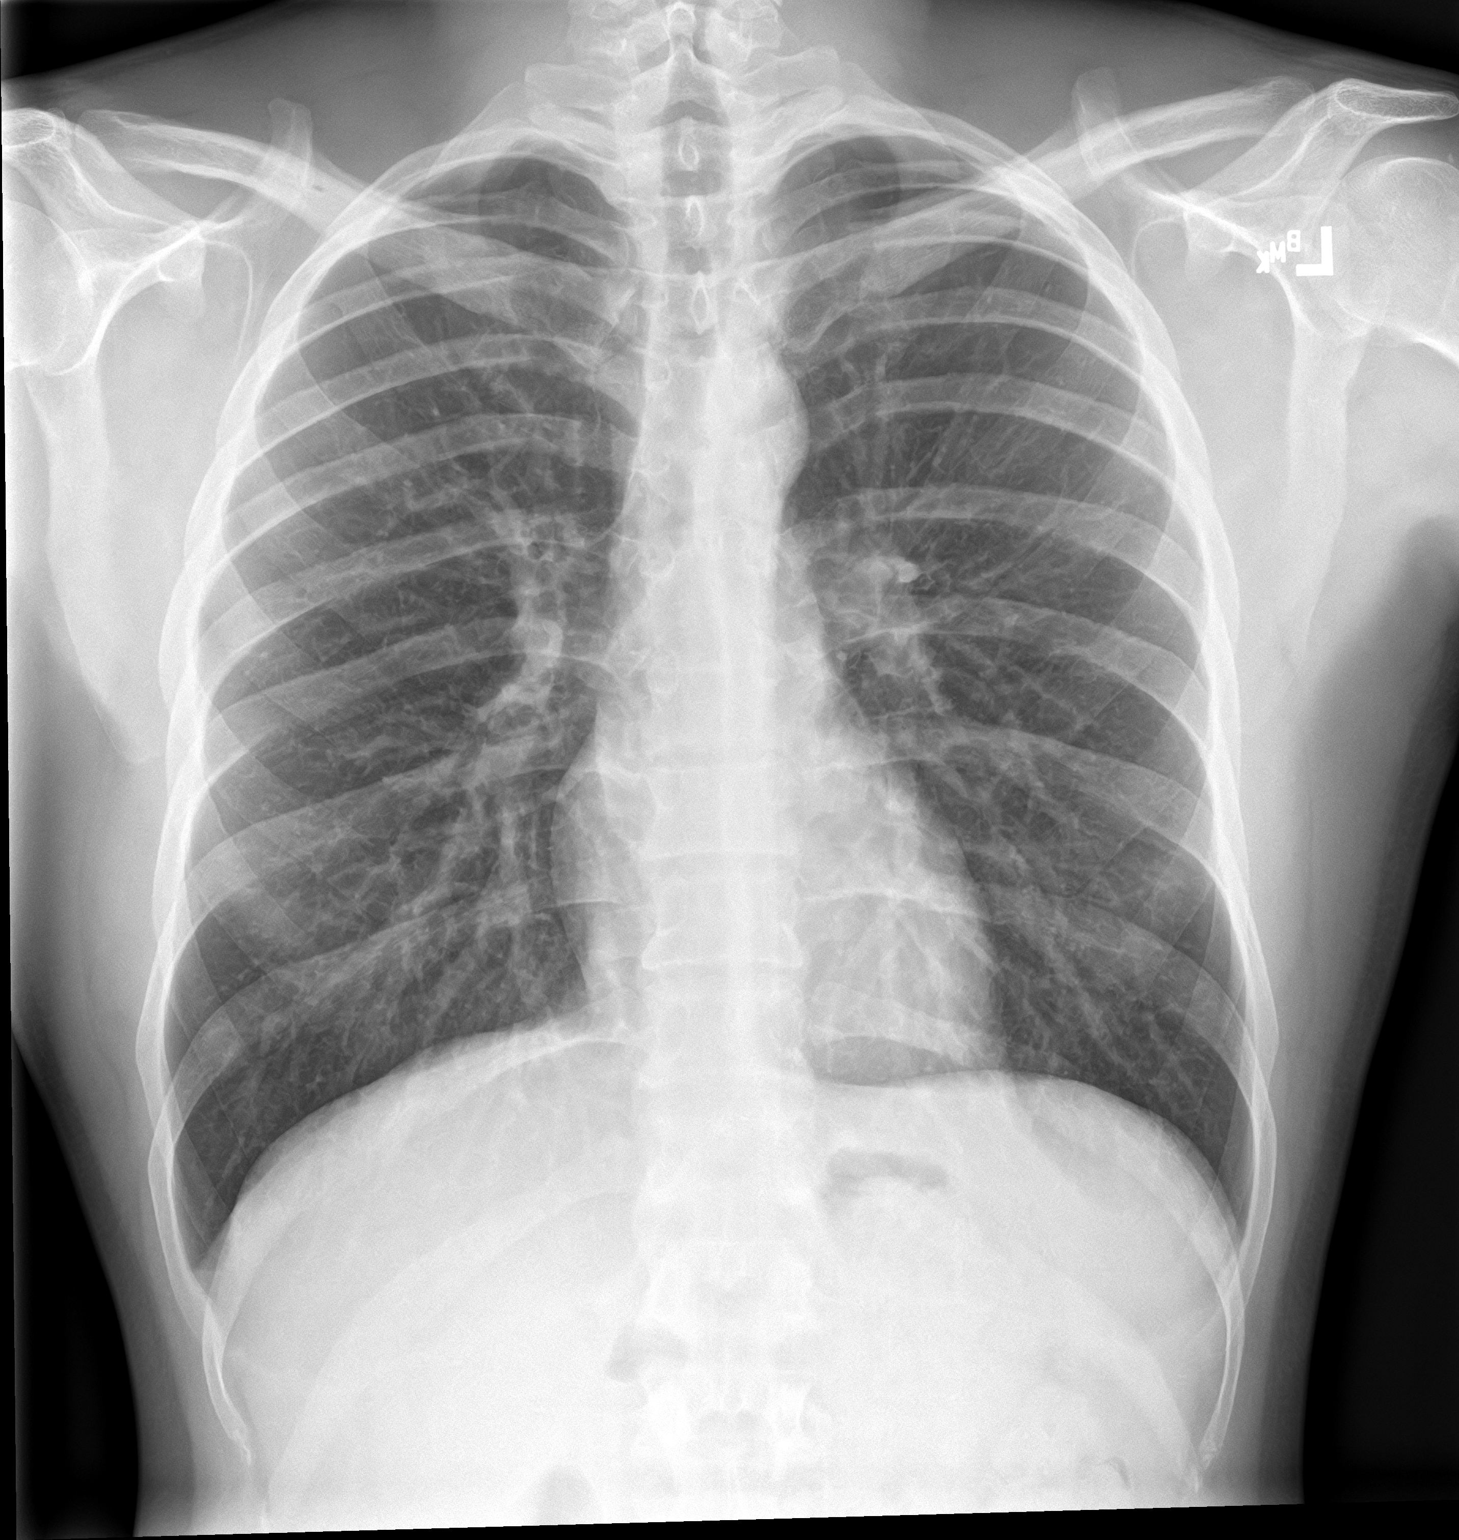

[chest lat]
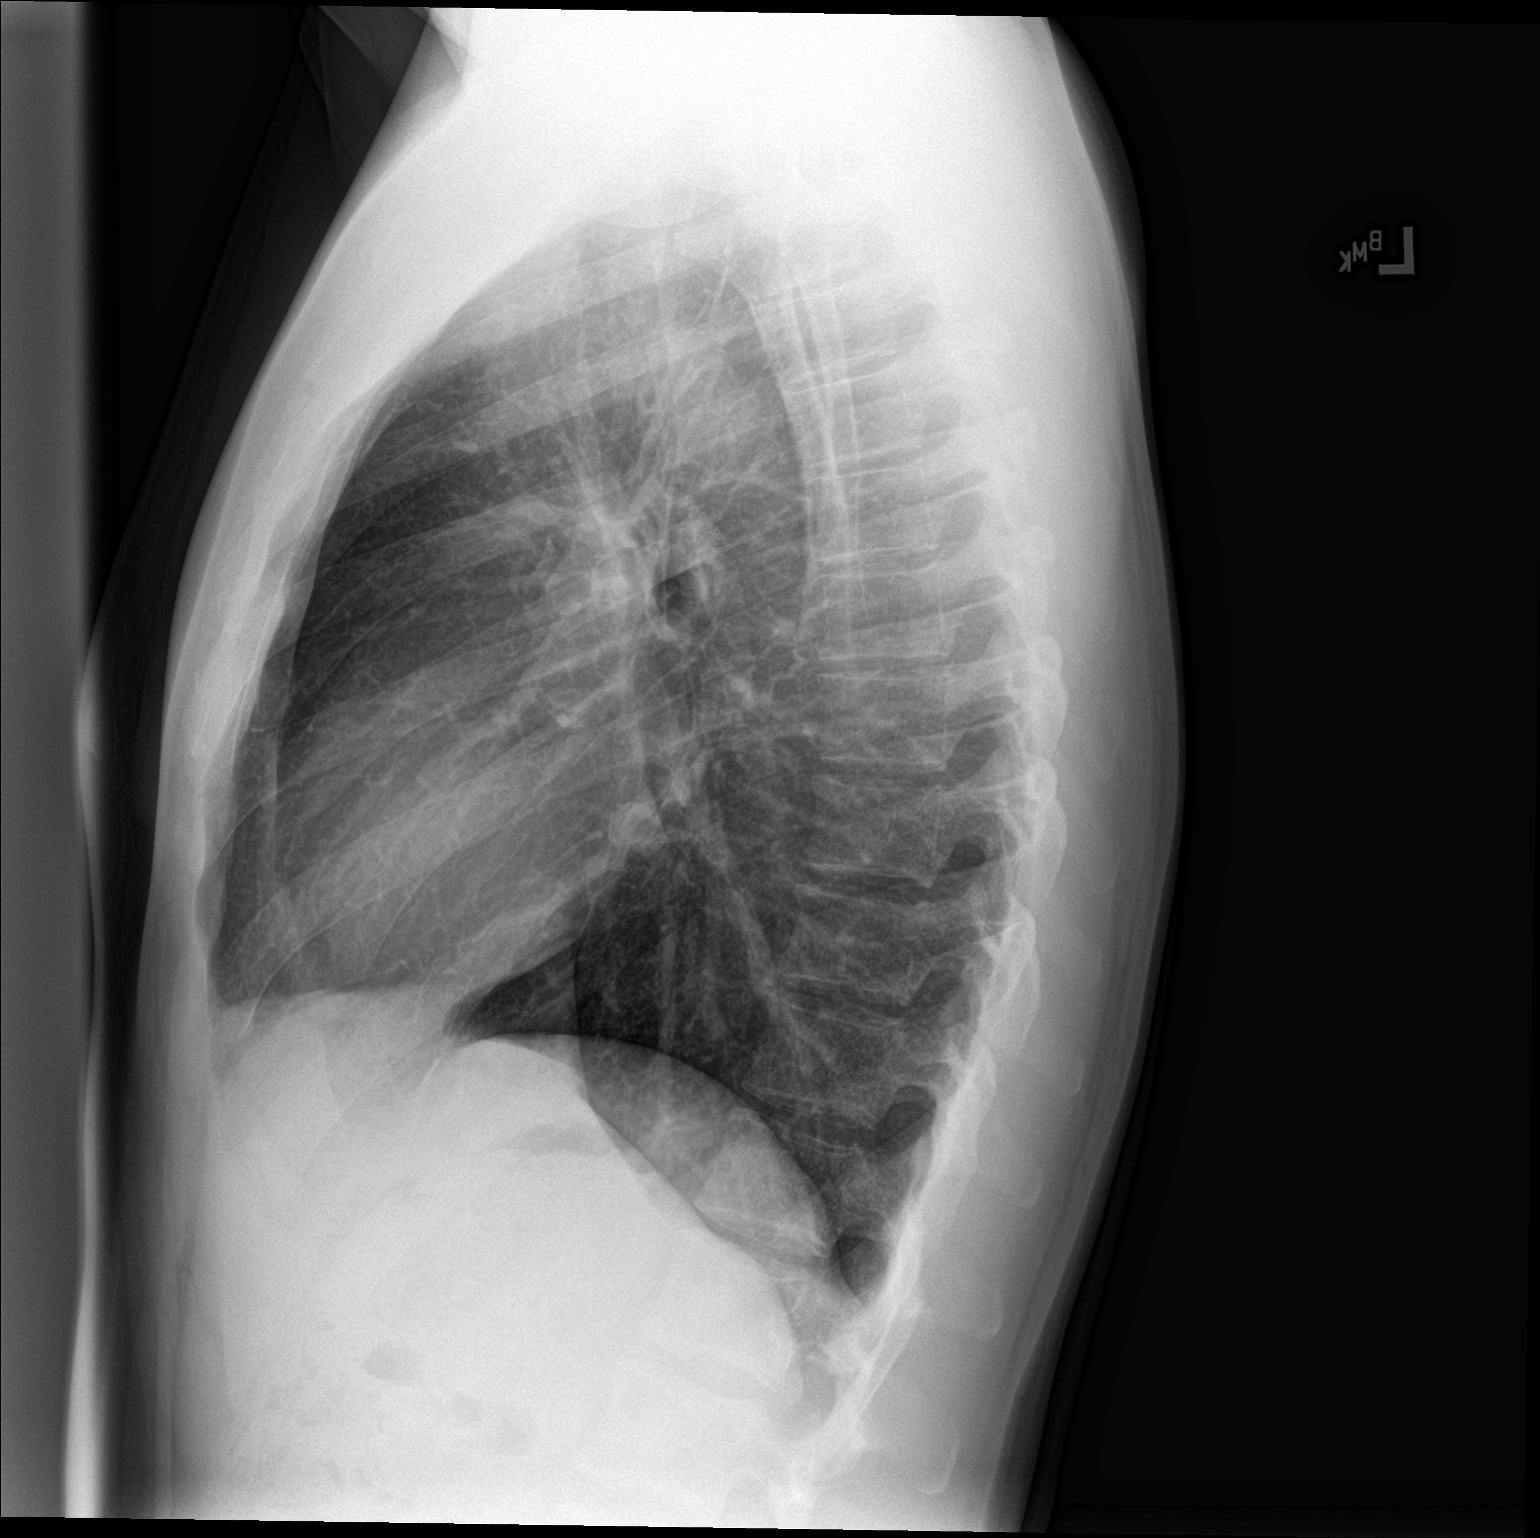

[2 of 2 positions shown; findings below may reference images not displayed]

FINDINGS: Cardiomediastinal silhouette unremarkable. Lungs clear.
Bronchovascular markings normal. Pulmonary vascularity normal. No
pneumothorax. No pleural effusions. Visualized bony thorax intact.
IMPRESSION: Normal examination.
